# Patient Record
Sex: Female | Born: 1965 | Race: White | Hispanic: No | Marital: Married | Smoking: Never smoker
Health system: Southern US, Community
[De-identification: ages and names within clinical notes are randomized; demographics above are authoritative.]

---

## 2016-12-21 DIAGNOSIS — S92354D Nondisplaced fracture of fifth metatarsal bone, right foot, subsequent encounter for fracture with routine healing: Secondary | ICD-10-CM | POA: Diagnosis not present

## 2016-12-23 DIAGNOSIS — M545 Low back pain: Secondary | ICD-10-CM | POA: Diagnosis not present

## 2017-03-28 DIAGNOSIS — Q111 Other anophthalmos: Secondary | ICD-10-CM | POA: Diagnosis not present

## 2017-04-12 DIAGNOSIS — Z4420 Encounter for fitting and adjustment of artificial eye, unspecified: Secondary | ICD-10-CM | POA: Diagnosis not present

## 2017-06-14 DIAGNOSIS — H10412 Chronic giant papillary conjunctivitis, left eye: Secondary | ICD-10-CM | POA: Diagnosis not present

## 2017-07-17 DIAGNOSIS — D485 Neoplasm of uncertain behavior of skin: Secondary | ICD-10-CM | POA: Diagnosis not present

## 2017-07-17 DIAGNOSIS — D2239 Melanocytic nevi of other parts of face: Secondary | ICD-10-CM | POA: Diagnosis not present

## 2017-07-17 DIAGNOSIS — L814 Other melanin hyperpigmentation: Secondary | ICD-10-CM | POA: Diagnosis not present

## 2017-09-03 DIAGNOSIS — Z01419 Encounter for gynecological examination (general) (routine) without abnormal findings: Secondary | ICD-10-CM | POA: Diagnosis not present

## 2017-09-11 DIAGNOSIS — R87619 Unspecified abnormal cytological findings in specimens from cervix uteri: Secondary | ICD-10-CM | POA: Diagnosis not present

## 2017-09-11 DIAGNOSIS — L819 Disorder of pigmentation, unspecified: Secondary | ICD-10-CM | POA: Diagnosis not present

## 2017-09-25 DIAGNOSIS — Z1211 Encounter for screening for malignant neoplasm of colon: Secondary | ICD-10-CM | POA: Diagnosis not present

## 2017-09-25 DIAGNOSIS — K640 First degree hemorrhoids: Secondary | ICD-10-CM | POA: Diagnosis not present

## 2017-09-26 DIAGNOSIS — N952 Postmenopausal atrophic vaginitis: Secondary | ICD-10-CM | POA: Diagnosis not present

## 2017-10-21 DIAGNOSIS — J069 Acute upper respiratory infection, unspecified: Secondary | ICD-10-CM | POA: Diagnosis not present

## 2017-10-24 DIAGNOSIS — J029 Acute pharyngitis, unspecified: Secondary | ICD-10-CM | POA: Diagnosis not present

## 2017-10-24 DIAGNOSIS — R05 Cough: Secondary | ICD-10-CM | POA: Diagnosis not present

## 2017-11-06 DIAGNOSIS — H6690 Otitis media, unspecified, unspecified ear: Secondary | ICD-10-CM | POA: Diagnosis not present

## 2017-11-06 DIAGNOSIS — Z0000 Encounter for general adult medical examination without abnormal findings: Secondary | ICD-10-CM | POA: Diagnosis not present

## 2017-11-06 DIAGNOSIS — Z23 Encounter for immunization: Secondary | ICD-10-CM | POA: Diagnosis not present

## 2017-11-06 DIAGNOSIS — R911 Solitary pulmonary nodule: Secondary | ICD-10-CM | POA: Diagnosis not present

## 2017-11-06 DIAGNOSIS — R109 Unspecified abdominal pain: Secondary | ICD-10-CM | POA: Diagnosis not present

## 2017-11-08 DIAGNOSIS — H6692 Otitis media, unspecified, left ear: Secondary | ICD-10-CM | POA: Diagnosis not present

## 2017-11-08 DIAGNOSIS — H9202 Otalgia, left ear: Secondary | ICD-10-CM | POA: Diagnosis not present

## 2017-11-11 DIAGNOSIS — Z0000 Encounter for general adult medical examination without abnormal findings: Secondary | ICD-10-CM | POA: Diagnosis not present

## 2017-11-11 DIAGNOSIS — R109 Unspecified abdominal pain: Secondary | ICD-10-CM | POA: Diagnosis not present

## 2017-11-14 ENCOUNTER — Other Ambulatory Visit: Payer: Self-pay | Admitting: Family Medicine

## 2017-11-14 DIAGNOSIS — R911 Solitary pulmonary nodule: Secondary | ICD-10-CM

## 2017-11-28 DIAGNOSIS — N952 Postmenopausal atrophic vaginitis: Secondary | ICD-10-CM | POA: Diagnosis not present

## 2017-11-29 ENCOUNTER — Ambulatory Visit
Admission: RE | Admit: 2017-11-29 | Discharge: 2017-11-29 | Disposition: A | Discharge status: Home / Self Care | Payer: 59 | Source: Ambulatory Visit | Attending: Family Medicine | Admitting: Family Medicine

## 2017-11-29 DIAGNOSIS — R911 Solitary pulmonary nodule: Secondary | ICD-10-CM | POA: Diagnosis not present

## 2017-12-04 DIAGNOSIS — K649 Unspecified hemorrhoids: Secondary | ICD-10-CM | POA: Diagnosis not present

## 2017-12-05 DIAGNOSIS — H938X9 Other specified disorders of ear, unspecified ear: Secondary | ICD-10-CM | POA: Diagnosis not present

## 2017-12-05 DIAGNOSIS — J329 Chronic sinusitis, unspecified: Secondary | ICD-10-CM | POA: Diagnosis not present

## 2017-12-05 DIAGNOSIS — K8020 Calculus of gallbladder without cholecystitis without obstruction: Secondary | ICD-10-CM | POA: Diagnosis not present

## 2017-12-24 DIAGNOSIS — R0981 Nasal congestion: Secondary | ICD-10-CM | POA: Diagnosis not present

## 2018-01-14 DIAGNOSIS — Z23 Encounter for immunization: Secondary | ICD-10-CM | POA: Diagnosis not present

## 2018-03-15 DIAGNOSIS — S30861A Insect bite (nonvenomous) of abdominal wall, initial encounter: Secondary | ICD-10-CM | POA: Diagnosis not present

## 2018-04-03 DIAGNOSIS — H10412 Chronic giant papillary conjunctivitis, left eye: Secondary | ICD-10-CM | POA: Diagnosis not present

## 2018-09-10 DIAGNOSIS — Z6834 Body mass index (BMI) 34.0-34.9, adult: Secondary | ICD-10-CM | POA: Diagnosis not present

## 2018-09-10 DIAGNOSIS — N390 Urinary tract infection, site not specified: Secondary | ICD-10-CM | POA: Diagnosis not present

## 2018-09-10 DIAGNOSIS — Z01419 Encounter for gynecological examination (general) (routine) without abnormal findings: Secondary | ICD-10-CM | POA: Diagnosis not present

## 2018-09-26 DIAGNOSIS — Z23 Encounter for immunization: Secondary | ICD-10-CM | POA: Diagnosis not present

## 2018-10-31 DIAGNOSIS — E785 Hyperlipidemia, unspecified: Secondary | ICD-10-CM | POA: Diagnosis not present

## 2018-10-31 DIAGNOSIS — Z0000 Encounter for general adult medical examination without abnormal findings: Secondary | ICD-10-CM | POA: Diagnosis not present

## 2018-10-31 DIAGNOSIS — Z23 Encounter for immunization: Secondary | ICD-10-CM | POA: Diagnosis not present

## 2018-10-31 DIAGNOSIS — M25511 Pain in right shoulder: Secondary | ICD-10-CM | POA: Diagnosis not present

## 2018-11-13 IMAGING — CT CT CHEST W/O CM
3 of 4 series · 17 of 30 positions shown, 19 images · non-contrast
Comparison: None.

CLINICAL DATA: Right lower lobe lung nodule seen on chest x-ray in
2280.

EXAM:
CT CHEST WITHOUT CONTRAST
TECHNIQUE: Multidetector CT imaging of the chest was performed following the
standard protocol without IV contrast.

[Series 3: chest w/o · axial · non-contrast · 0.79mm/px · z∈[-258,-55]mm · 6 of 115 slices shown, 8 images]
[im 17/115  mediastinal]
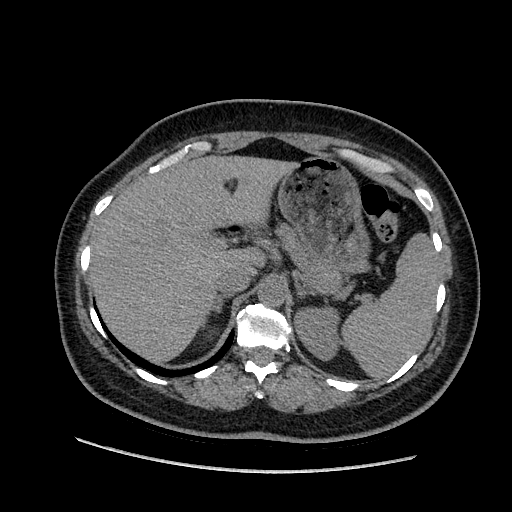
[im 17/115  lung]
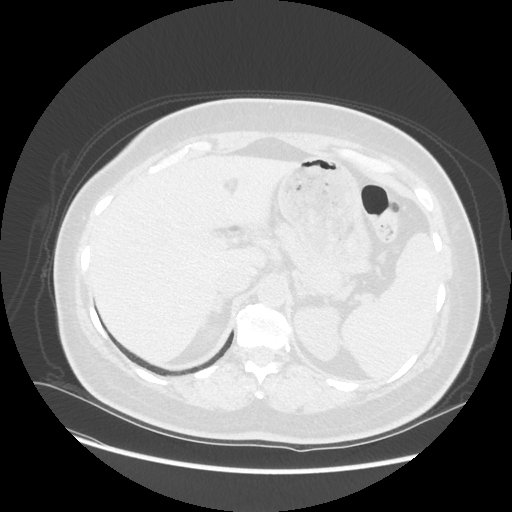
[im 33/115  lung]
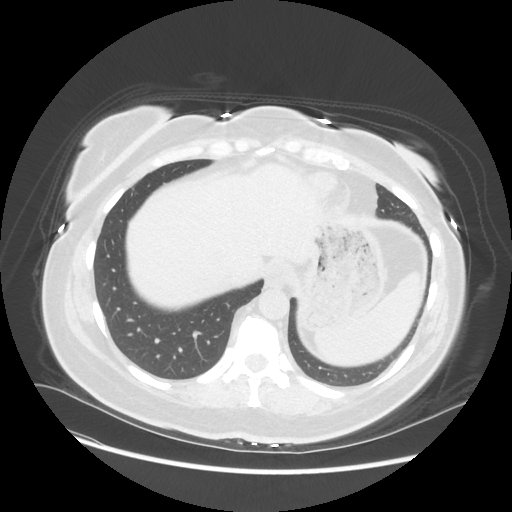
[im 49/115  lung]
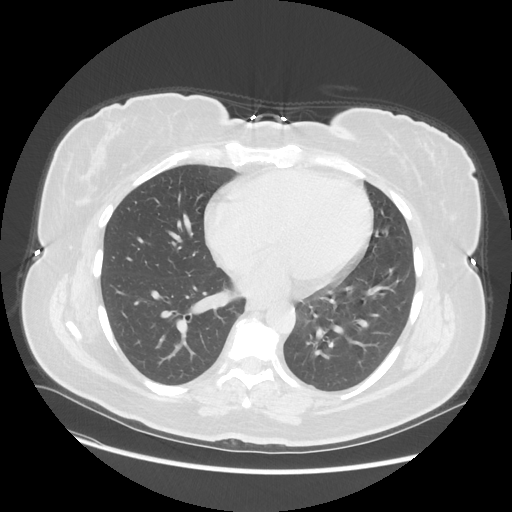
[im 66/115  lung]
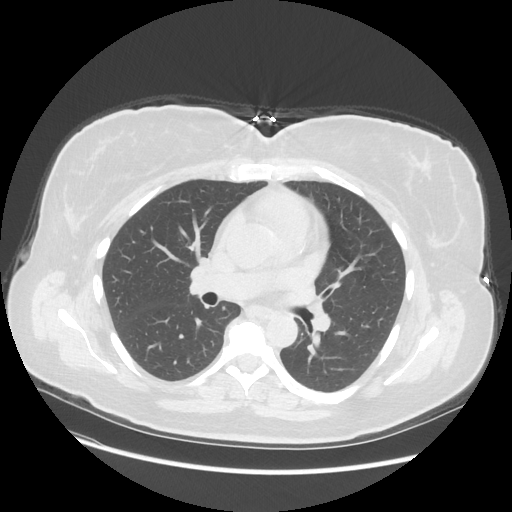
[im 82/115  mediastinal]
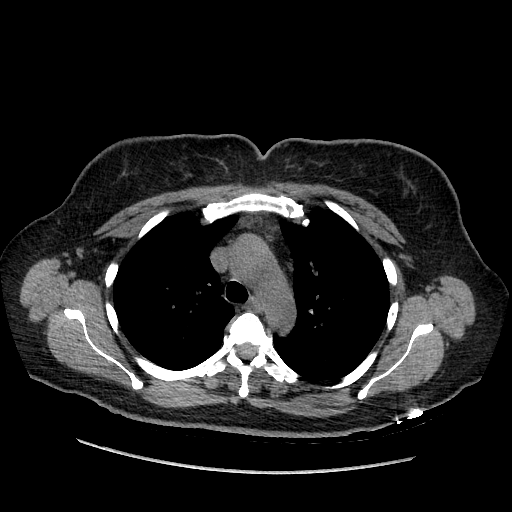
[im 82/115  lung]
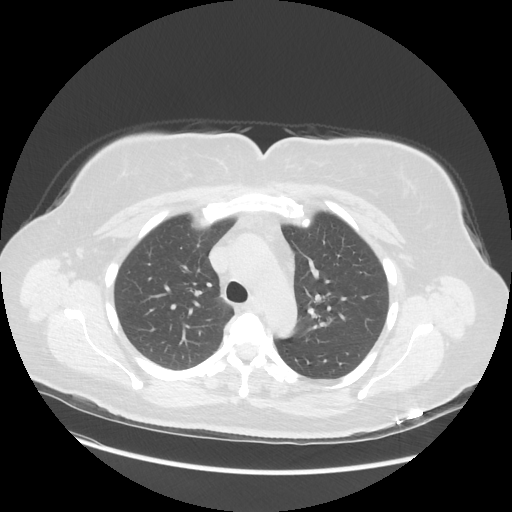
[im 98/115  lung]
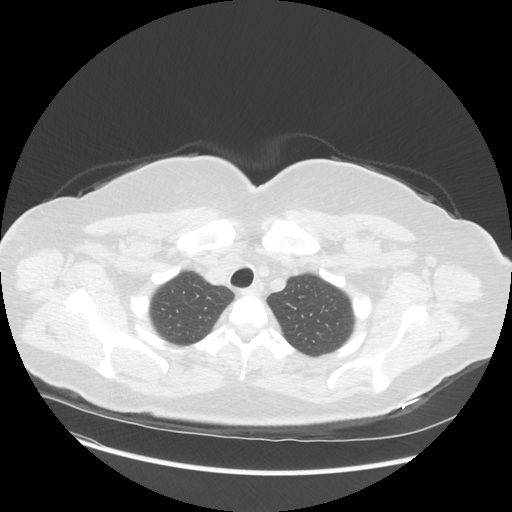

[Series 4: lung windows · axial · 0.79mm/px · z∈[-258,-55]mm · 6 of 115 slices shown]
[im 17/115  lung]
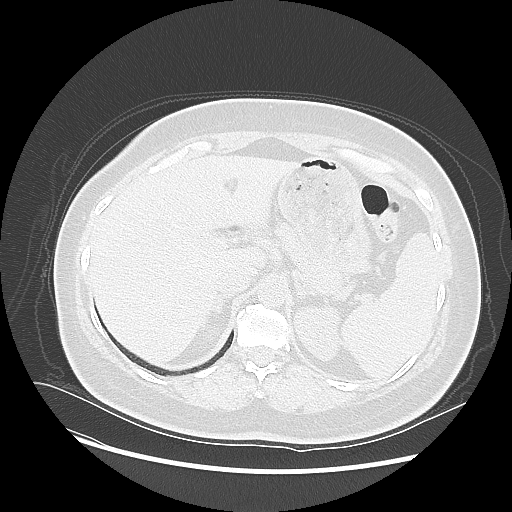
[im 33/115  lung]
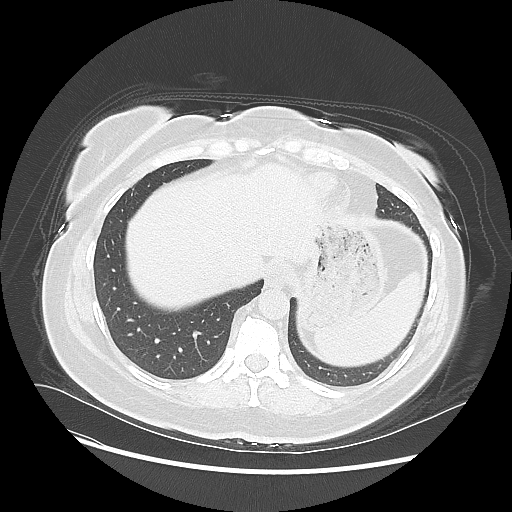
[im 49/115  lung]
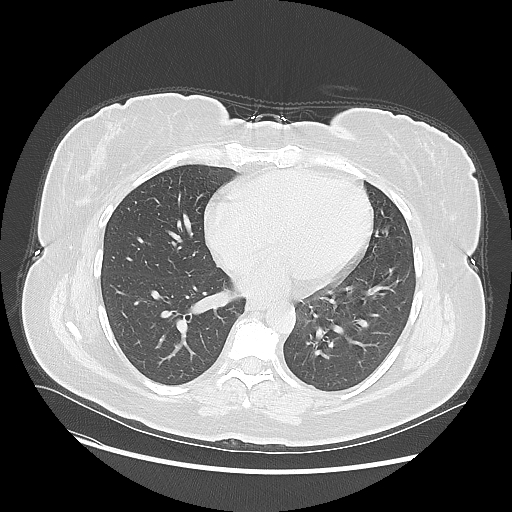
[im 66/115  lung]
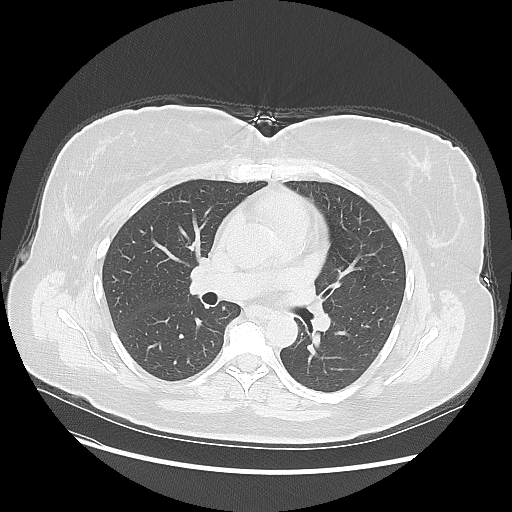
[im 82/115  lung]
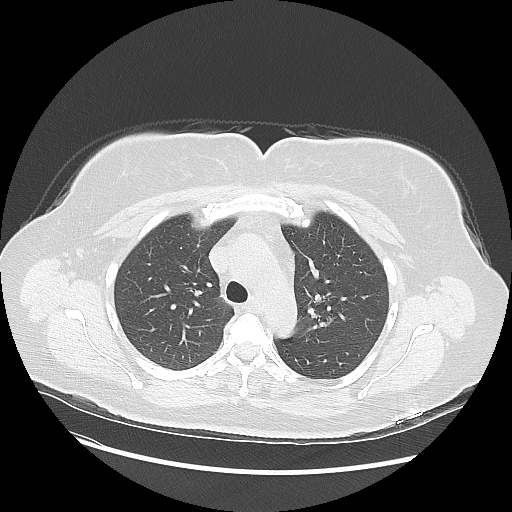
[im 98/115  lung]
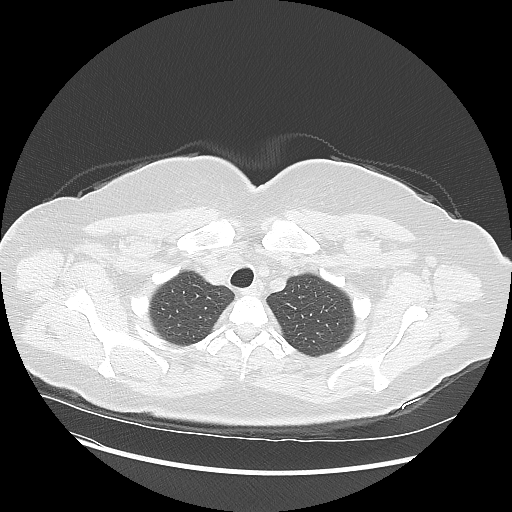

[Series 602: sagittal body · sagittal · 0.79mm/px · 5 of 164 slices shown]
[im 15/164  mediastinal]
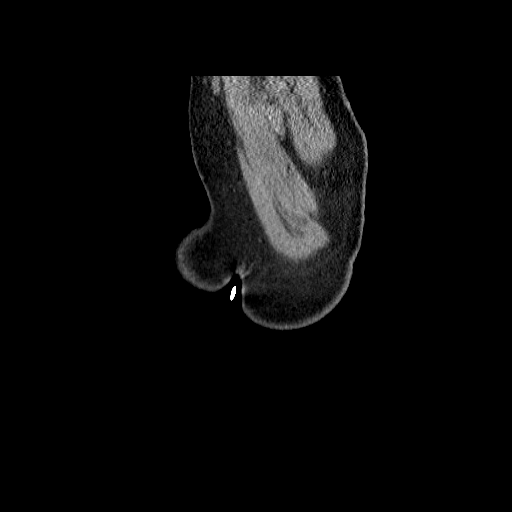
[im 30/164  mediastinal]
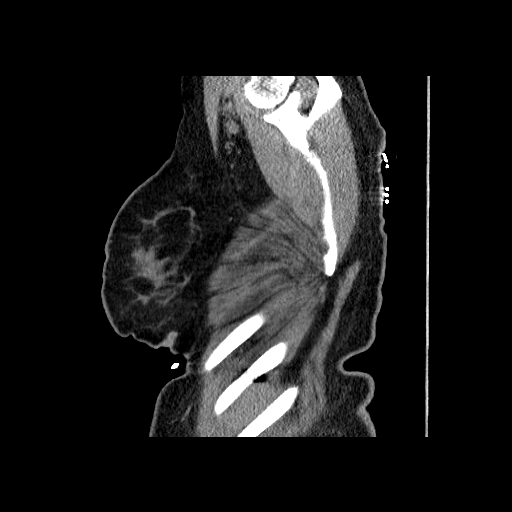
[im 60/164  mediastinal]
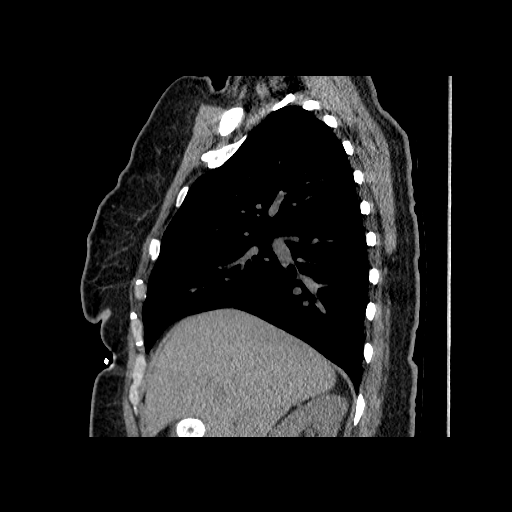
[im 75/164  mediastinal]
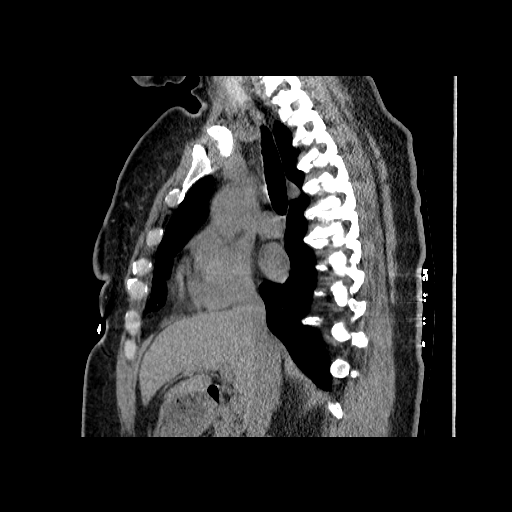
[im 89/164  mediastinal]
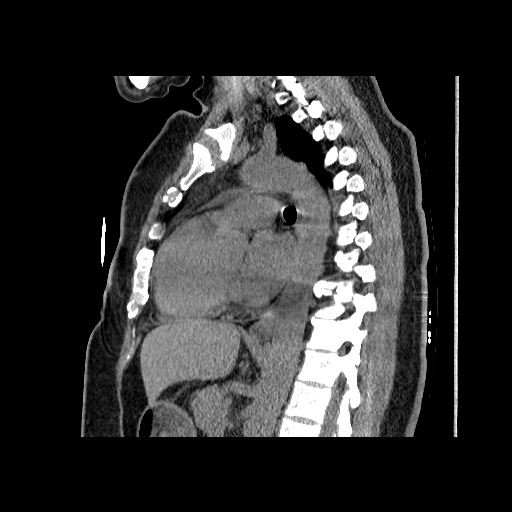

[17 of 30 positions shown; findings below may reference images not displayed]

FINDINGS: Cardiovascular: No significant vascular findings. Normal heart size.
No pericardial effusion. Normal caliber thoracic aorta. Mild
atherosclerotic vascular calcification of the left anterior
descending coronary artery.

Mediastinum/Nodes: No enlarged mediastinal or axillary lymph nodes.
Thyroid gland, trachea, and esophagus demonstrate no significant
findings.

Lungs/Pleura: Lungs are clear. No pleural effusion or pneumothorax.

Upper Abdomen: No acute abnormality.  Cholelithiasis.

Musculoskeletal: No chest wall abnormality. No acute or suspicious
osseous findings.
IMPRESSION: 1. Clear lungs.  No suspicious pulmonary nodule.
2. Cholelithiasis.

## 2021-03-21 DIAGNOSIS — E039 Hypothyroidism, unspecified: Secondary | ICD-10-CM | POA: Insufficient documentation

## 2022-06-05 ENCOUNTER — Ambulatory Visit (INDEPENDENT_AMBULATORY_CARE_PROVIDER_SITE_OTHER): Payer: 59 | Admitting: Podiatry

## 2022-06-05 ENCOUNTER — Encounter: Payer: Self-pay | Admitting: Podiatry

## 2022-06-05 ENCOUNTER — Ambulatory Visit (INDEPENDENT_AMBULATORY_CARE_PROVIDER_SITE_OTHER): Payer: 59

## 2022-06-05 DIAGNOSIS — M205X9 Other deformities of toe(s) (acquired), unspecified foot: Secondary | ICD-10-CM | POA: Diagnosis not present

## 2022-06-05 DIAGNOSIS — M76821 Posterior tibial tendinitis, right leg: Secondary | ICD-10-CM | POA: Diagnosis not present

## 2022-06-05 DIAGNOSIS — R911 Solitary pulmonary nodule: Secondary | ICD-10-CM | POA: Insufficient documentation

## 2022-06-05 DIAGNOSIS — K8020 Calculus of gallbladder without cholecystitis without obstruction: Secondary | ICD-10-CM | POA: Insufficient documentation

## 2022-06-05 DIAGNOSIS — M25559 Pain in unspecified hip: Secondary | ICD-10-CM | POA: Insufficient documentation

## 2022-06-05 DIAGNOSIS — E785 Hyperlipidemia, unspecified: Secondary | ICD-10-CM | POA: Insufficient documentation

## 2022-06-05 DIAGNOSIS — M778 Other enthesopathies, not elsewhere classified: Secondary | ICD-10-CM

## 2022-06-05 MED ORDER — METHYLPREDNISOLONE 4 MG PO TBPK: ORAL_TABLET | ORAL | 0 refills | Status: DC

## 2022-06-05 MED ORDER — TRIAMCINOLONE ACETONIDE 40 MG/ML IJ SUSP
20.0000 mg | Freq: Once | INTRAMUSCULAR | Status: AC
  Administered 2022-06-05: 20 mg

## 2022-06-05 MED ORDER — MELOXICAM 15 MG PO TABS: 15.0000 mg | ORAL_TABLET | Freq: Every day | ORAL | 3 refills | Status: DC

## 2022-06-05 NOTE — Progress Notes (Signed)
Subjective:  Patient ID: Paige Mccarthy, female    DOB: 28-Nov-1965,  MRN: [id number] HPI Chief Complaint  Patient presents with   Foot Pain    Medial foot right - aching, burning, swelling, x 2 months, radiating into leg, feet have been wide with calluses since pregnancy 16 years ago, trouble finding shoes, also discomfort 1st MPJ bilateral (arthritis?)   New Patient (Initial Visit)    56 y.o. female presents with the above complaint.   ROS: Denies fever chills nausea vomit muscle aches pains calf pain back pain chest pain shortness of breath.  No past medical history on file.   Current Outpatient Medications:    levothyroxine (SYNTHROID) 25 MCG tablet, TAKE 1 TABLET EVERY DAY FOR 4 DAYS OF THE WEEK AND 2 TABLETS DAILY FOR 3 DAYS OF THE WEEK, Disp: , Rfl:    meloxicam (MOBIC) 15 MG tablet, Take 1 tablet (15 mg total) by mouth daily., Disp: 30 tablet, Rfl: 3   methylPREDNISolone (MEDROL DOSEPAK) 4 MG TBPK tablet, 6 day dose pack - take as directed, Disp: 21 tablet, Rfl: 0  No Known Allergies Review of Systems Objective:  There were no vitals filed for this visit.  General: Well developed, nourished, in no acute distress, alert and oriented x3   Dermatological: Skin is warm, dry and supple bilateral. Nails x 10 are well maintained; remaining integument appears unremarkable at this time. There are no open sores, no preulcerative lesions, no rash or signs of infection present.  Vascular: Dorsalis Pedis artery and Posterior Tibial artery pedal pulses are 2/4 bilateral with immedate capillary fill time. Pedal hair growth present. No varicosities and no lower extremity edema present bilateral.   Neruologic: Grossly intact via light touch bilateral. Vibratory intact via tuning fork bilateral. Protective threshold with Semmes Wienstein monofilament intact to all pedal sites bilateral. Patellar and Achilles deep tendon reflexes 2+ bilateral. No Babinski or clonus noted bilateral.    Musculoskeletal: No gross boney pedal deformities bilateral. No pain, crepitus, or limitation noted with foot and ankle range of motion bilateral. Muscular strength 5/5 in all groups tested bilateral.  Pain on palpation of the posterior tibial tendon at its insertion site on the navicular and just proximal to that area just beneath and distal to the medial malleolus.  There is mild erythema in this area.  With some edema.  Hallux limitus is noted and significant bilateral.  Moderately painful.  Gait: Unassisted, Nonantalgic.    Radiographs:  Radiographs taken today demonstrate an osseously mature individual with moderate to severe pes planovalgus.  Next chronic finding would be first metatarsophalangeal joint osteoarthritis bilaterally dorsal spurring of the head of the first metatarsal and base of the proximal phalanx with joint space narrowing subchondral sclerosis eburnation and irregularity of the base of the proximal phalanx.  She also demonstrates os naviculare bilaterally the right foot does demonstrate some swelling in the soft tissue area of that posterior tibial tendon.  Assessment & Plan:   Assessment: Pes planovalgus bilateral.  Hallux limitus first metatarsophalangeal joint bilateral.  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  Plan: Discussed etiology pathology conservative surgical therapy started her on a Medrol Dosepak to be followed by meloxicam.  Placed a small amount of Kenalog 5 mg was injected into the tendon sheath with local anesthetic.  I also started her on methylprednisolone to be followed by meloxicam and placed her in a cam boot.  Discussed icing and elevation as well.  We will follow-up with  her in 1 month for this.  May need to consider MRI if not improved.     Brittinie Wherley T. [place], [place]

## 2022-06-06 ENCOUNTER — Telehealth: Payer: Self-pay | Admitting: Podiatry

## 2022-06-06 MED ORDER — MELOXICAM 15 MG PO TABS: 15.0000 mg | ORAL_TABLET | Freq: Every day | ORAL | 3 refills | Status: AC

## 2022-06-06 MED ORDER — METHYLPREDNISOLONE 4 MG PO TBPK: ORAL_TABLET | ORAL | 0 refills | Status: DC

## 2022-06-06 NOTE — Telephone Encounter (Signed)
Patient called stating her Rx was sent to the wrong pharmacy. She is requesting it to be sent to [facility] [place], [place], [place] [address].   Please advise.

## 2022-06-06 NOTE — Telephone Encounter (Signed)
Medication sent to corrected pharmacy

## 2022-06-06 NOTE — Addendum Note (Signed)
Addended by: Rip Harbour on: 06/06/2022 05:13 PM   Modules accepted: Orders

## 2022-07-17 ENCOUNTER — Encounter: Payer: Self-pay | Admitting: Podiatry

## 2022-07-17 ENCOUNTER — Ambulatory Visit (INDEPENDENT_AMBULATORY_CARE_PROVIDER_SITE_OTHER): Payer: 59 | Admitting: Podiatry

## 2022-07-17 DIAGNOSIS — M76821 Posterior tibial tendinitis, right leg: Secondary | ICD-10-CM

## 2022-07-17 DIAGNOSIS — M7661 Achilles tendinitis, right leg: Secondary | ICD-10-CM

## 2022-07-17 NOTE — Progress Notes (Signed)
She presents today states that her right foot is feeling about 80% better she states that she is feeling more pain in her Achilles tendon she thinks she injured 2 or 3 years ago.  Objective: Vitals are stable she is alert and oriented x3 much decrease in edema and pain on palpation of the posterior tibial tendon of the right foot still retains some tenderness on palpation of the posterior tibial tendon at its insertion on the navicular tuberosity but done generally overall much improved.  Inversion against resistance is nonpainful.  Inflammation along the Achilles tendon of the left heel is notable it appears that she had probably had a tear in there 1 time it is still hypertrophic in the watershed area.  It is mildly warm to the touch but she does have good plantarflexion against resistance with minimal tenderness.  Assessment: Mid substance tear most likely of Achilles tendon left new diagnosis  Well-healing posterior tibial tendinitis right.  Plan: At this point were going to place her in a Tri-Lock brace on the right side and place her cam walker that she has been using on the left foot.  We also suggested the use of Voltaren cream/gel.  I will follow-up with her in about 4 weeks.

## 2022-09-04 ENCOUNTER — Ambulatory Visit: Payer: 59 | Admitting: Podiatry

## 2022-10-01 ENCOUNTER — Ambulatory Visit (INDEPENDENT_AMBULATORY_CARE_PROVIDER_SITE_OTHER): Payer: 59 | Admitting: Family Medicine

## 2022-10-01 ENCOUNTER — Encounter: Payer: Self-pay | Admitting: Family Medicine

## 2022-10-01 VITALS — BP 122/84 | Ht 66.0 in | Wt 230.0 lb

## 2022-10-01 DIAGNOSIS — M76821 Posterior tibial tendinitis, right leg: Secondary | ICD-10-CM | POA: Diagnosis not present

## 2022-10-01 NOTE — Progress Notes (Signed)
PCP: London Pepper, MD  Subjective:   HPI: Patient is a 56 y.o. female here for right ankle pain.  Patient reports she's had over 6 months of medial right ankle pain. No acute injury or trauma. She is not a runner. Saw podiatry for this and was given steroid injection, placed in boot which she wore from July through September. Given steroid dose pack but did not take. She has not been given any rehab exercises. Bought some inserts herself and placed in shoes. Some swelling associated with this at times including within past week was quite swollen. No bruising.  History reviewed. No pertinent past medical history.  Current Outpatient Medications on File Prior to Visit  Medication Sig Dispense Refill   levothyroxine (SYNTHROID) 50 MCG tablet Take by mouth.     meloxicam (MOBIC) 15 MG tablet Take 1 tablet (15 mg total) by mouth daily. 30 tablet 3   No current facility-administered medications on file prior to visit.    History reviewed. No pertinent surgical history.  No Known Allergies  BP 122/84   Ht '5\' 6"'$  (1.676 m)   Wt 230 lb (104.3 kg)   BMI 37.12 kg/m       No data to display              No data to display              Objective:  Physical Exam:  Gen: NAD, comfortable in exam room  Right ankle/foot: Pes planus.  Post tib tendon dysfunction.  Transverse arch collapse, wide forefoot.  No other gross deformity, swelling, ecchymoses FROM with pain on internal rotation TTP distal post tib tendon and at insertion on navicular Negative ant drawer and negative talar tilt.   Negative syndesmotic compression. Thompsons test negative. NV intact distally.   Limited MSK u/s right foot:  no visible tear of distal post tib tendon.  No edema overlying navicular or cortical irregularity of navicular.  Assessment & Plan:  1. Right foot/ankle pain - 2/2 post tib tendinopathy and dysfunction.  Arch supports (sports insoles with small scaphoid pads) and home  exercises which were reviewed today.  Icing, voltaren gel.  Consider nitro patches, physical therapy if not improving.  F/u in 6 weeks.

## 2022-10-01 NOTE — Patient Instructions (Signed)
You have posterior tibialis tendinopathy. Arch supports are important - use the sports insoles with scaphoid pads (you can move these in and out of different shoes). Avoid flat shoes, barefoot walking as much as possible. Do home exercises most days of the week the next 6 weeks. Icing 15 minutes at a time 3-4 times a day as needed. Voltaren gel topically up to 4 times a day. Follow up with me in 6 weeks. Consider nitro patches if not improving.

## 2022-11-07 ENCOUNTER — Ambulatory Visit: Payer: 59 | Admitting: Family Medicine

## 2023-07-15 ENCOUNTER — Other Ambulatory Visit: Payer: Self-pay | Admitting: Obstetrics and Gynecology

## 2023-07-15 DIAGNOSIS — R928 Other abnormal and inconclusive findings on diagnostic imaging of breast: Secondary | ICD-10-CM

## 2023-07-24 ENCOUNTER — Ambulatory Visit: Admission: RE | Admit: 2023-07-24 | Payer: 59 | Source: Ambulatory Visit

## 2023-07-24 ENCOUNTER — Ambulatory Visit
Admission: RE | Admit: 2023-07-24 | Discharge: 2023-07-24 | Disposition: A | Discharge status: Home / Self Care | Payer: 59 | Source: Ambulatory Visit | Attending: Obstetrics and Gynecology | Admitting: Obstetrics and Gynecology

## 2023-07-24 DIAGNOSIS — R928 Other abnormal and inconclusive findings on diagnostic imaging of breast: Secondary | ICD-10-CM
